# Patient Record
Sex: Female | Born: 1995 | Race: White | Hispanic: No | Marital: Single | Smoking: Never smoker
Health system: Southern US, Community
[De-identification: ages and names within clinical notes are randomized; demographics above are authoritative.]

---

## 2016-04-24 ENCOUNTER — Ambulatory Visit (INDEPENDENT_AMBULATORY_CARE_PROVIDER_SITE_OTHER): Payer: PPO

## 2016-04-24 ENCOUNTER — Encounter (HOSPITAL_COMMUNITY): Payer: Self-pay | Admitting: Emergency Medicine

## 2016-04-24 ENCOUNTER — Ambulatory Visit (HOSPITAL_COMMUNITY)
Admission: EM | Admit: 2016-04-24 | Discharge: 2016-04-24 | Disposition: A | Discharge status: Home / Self Care | Payer: PPO | Attending: Family Medicine | Admitting: Family Medicine

## 2016-04-24 DIAGNOSIS — W2107XA Struck by softball, initial encounter: Secondary | ICD-10-CM

## 2016-04-24 DIAGNOSIS — S6992XA Unspecified injury of left wrist, hand and finger(s), initial encounter: Secondary | ICD-10-CM

## 2016-04-24 MED ORDER — IBUPROFEN 800 MG PO TABS
ORAL_TABLET | ORAL | Status: AC
  Filled 2016-04-24: qty 1

## 2016-04-24 MED ORDER — IBUPROFEN 800 MG PO TABS
800.0000 mg | ORAL_TABLET | Freq: Once | ORAL | Status: AC
  Administered 2016-04-24: 800 mg via ORAL

## 2016-04-24 NOTE — ED Provider Notes (Signed)
CSN: [id number]     Arrival date & time 04/24/16  1858 History   None    Chief Complaint  Patient presents with  . Finger Injury   (Consider location/radiation/quality/duration/timing/severity/associated sxs/prior Treatment) Patient presents today for left hand injury onset 1300 today when a ball hit her thumb while playing softball. She reports pain and swelling at the area.       History reviewed. No pertinent past medical history. History reviewed. No pertinent surgical history. History reviewed. No pertinent family history. Social History  Substance Use Topics  . Smoking status: Never Smoker  . Smokeless tobacco: Never Used  . Alcohol use Yes   OB History    No data available     Review of Systems  Constitutional:       As stated in the HPI    Allergies  Penicillins  Home Medications   Prior to Admission medications   Medication Sig Start Date End Date Taking? Authorizing Provider  norethindrone-ethinyl estradiol (MICROGESTIN,JUNEL,LOESTRIN) 1-20 MG-MCG tablet Take 1 tablet by mouth daily.   Yes Historical Provider, MD   Meds Ordered and Administered this Visit   Medications  ibuprofen (ADVIL,MOTRIN) tablet 800 mg (800 mg Oral Given 04/24/16 1951)    BP 136/75 (BP Location: Left Arm)   Pulse 60   Temp 98.7 F (37.1 C) (Oral)   Resp 16   SpO2 100%  No data found.   Physical Exam  Constitutional: She is oriented to person, place, and time. She appears well-developed and well-nourished.  Cardiovascular: Normal rate, regular rhythm and normal heart sounds.   Pulmonary/Chest: Effort normal and breath sounds normal. She has no wheezes.  Musculoskeletal:  Left hand has full ROM. Left wrist also has full ROM. Mild swelling noted at the thumb. Is tender to palpate at the [place]ar region. Skin intact.   Neurological: She is alert and oriented to person, place, and time.  Skin: Skin is warm and dry.  Nursing note and vitals reviewed.   Urgent Care Course      Procedures (including critical care time)  Labs Review Labs Reviewed - No data to display  Imaging Review Dg Hand Complete Left  Result Date: 04/24/2016 CLINICAL DATA:  Sports injury with pain in the thumb and index finger EXAM: LEFT HAND - COMPLETE 3+ VIEW COMPARISON:  None. FINDINGS: There is no evidence of fracture or dislocation. There is no evidence of arthropathy or other focal bone abnormality. Soft tissues are unremarkable. IMPRESSION: No fracture or dislocation of the left hand. Electronically Signed   By: Deatra Robinson M.D.   On: 04/24/2016 19:49    MDM   1. Injury of left hand, initial encounter    Xray negative for fracture or dislocation.  1) Apply ice to the area 2) Rest the hand 3) Take ibuprofen for pain relief 4) Follow up with your primary care doctor if no improvement is noted.    Lucia Estelle, NP 04/24/16 2000

## 2016-04-24 NOTE — ED Triage Notes (Signed)
Pt c/o left thumb inj onset 1300  Was playing softball and ball hit her thumb   Sx today include: swelling, pain  A&O x4... NAD

## 2018-03-14 IMAGING — DX DG HAND COMPLETE 3+V*L*
3 series · 3 of 3 positions shown · non-contrast
Comparison: None.

CLINICAL DATA: Sports injury with pain in the thumb and index
finger

EXAM:
LEFT HAND - COMPLETE 3+ VIEW

[hand pa]
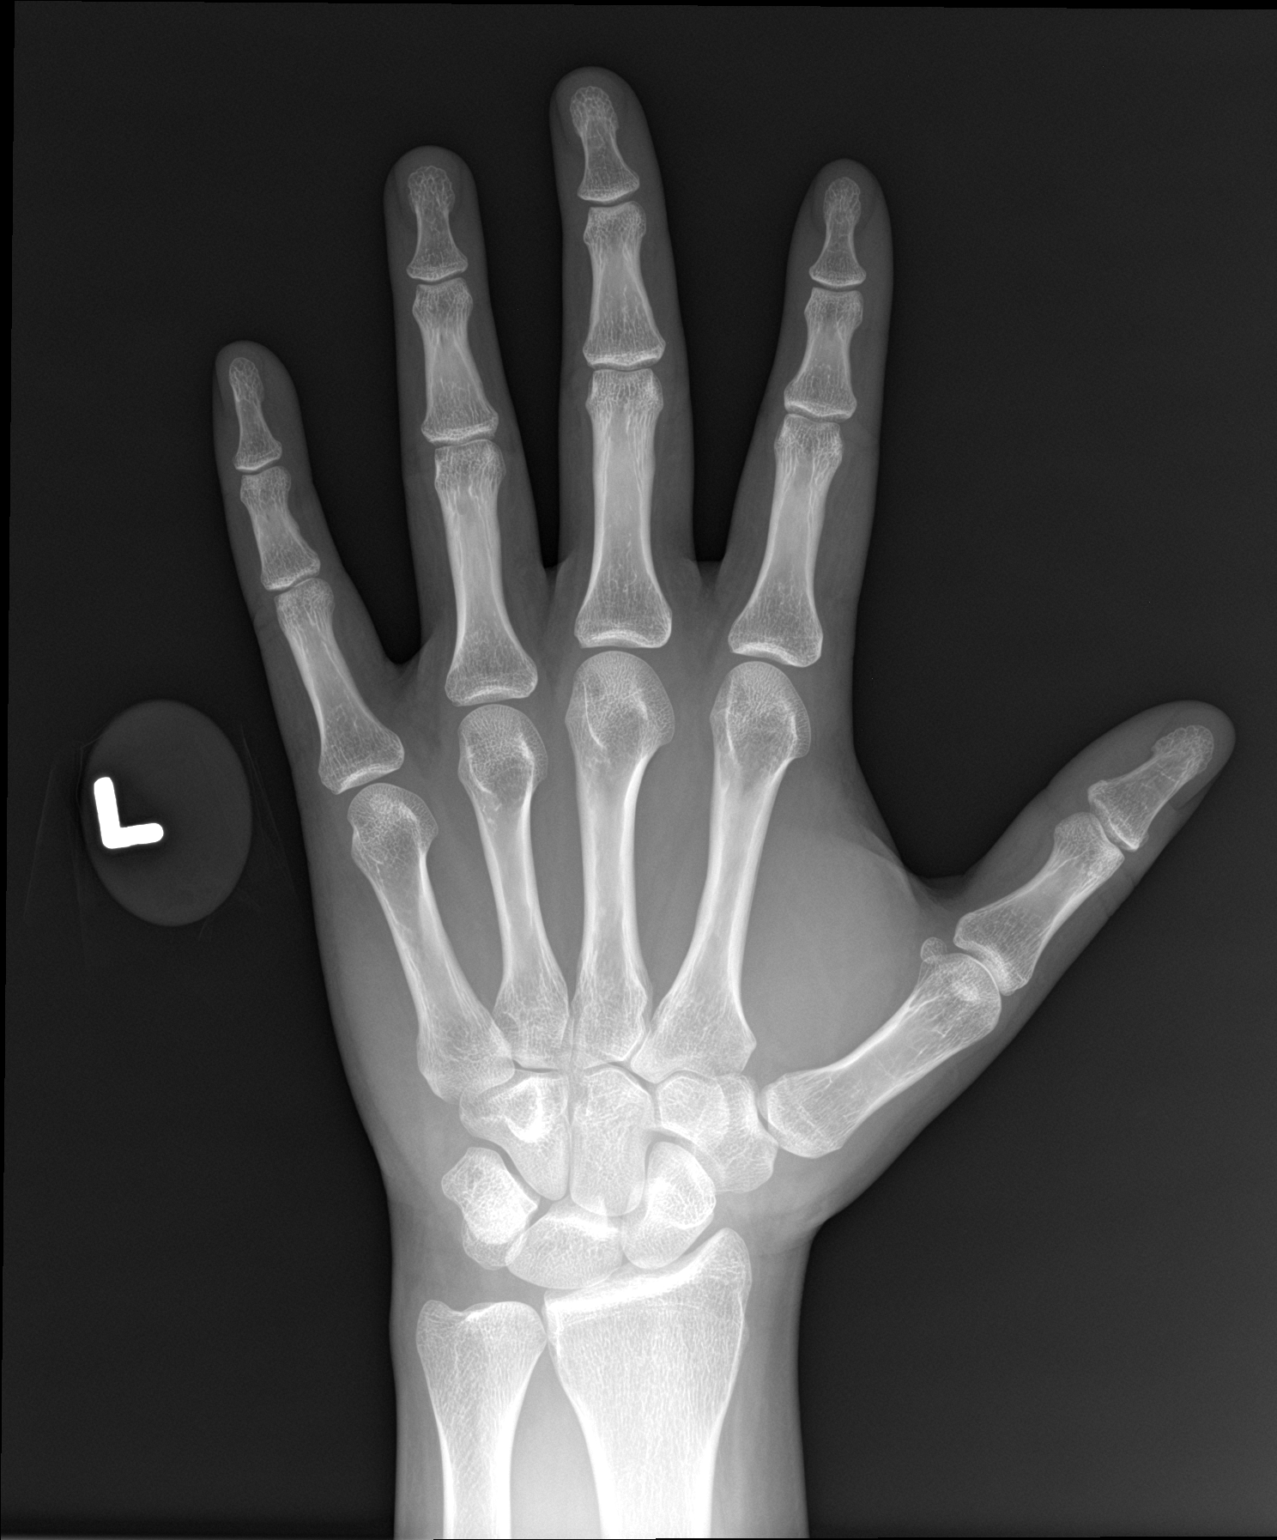

[hand obl]
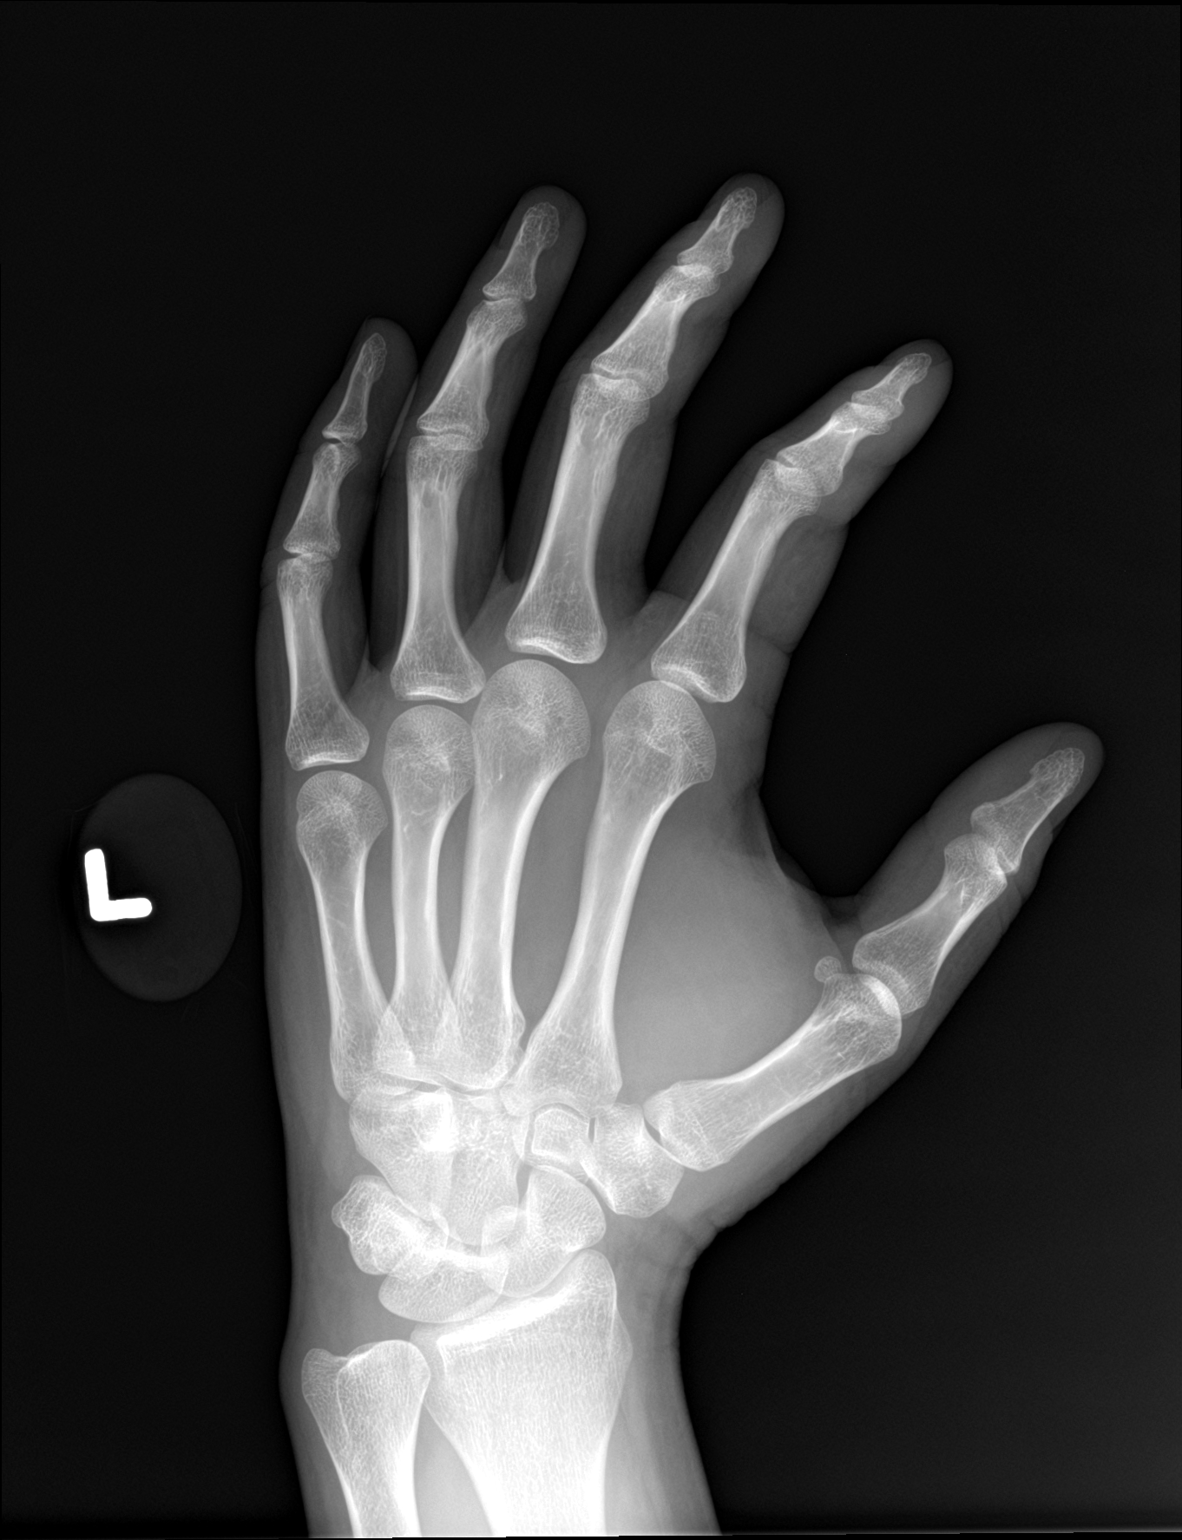

[hand lat]
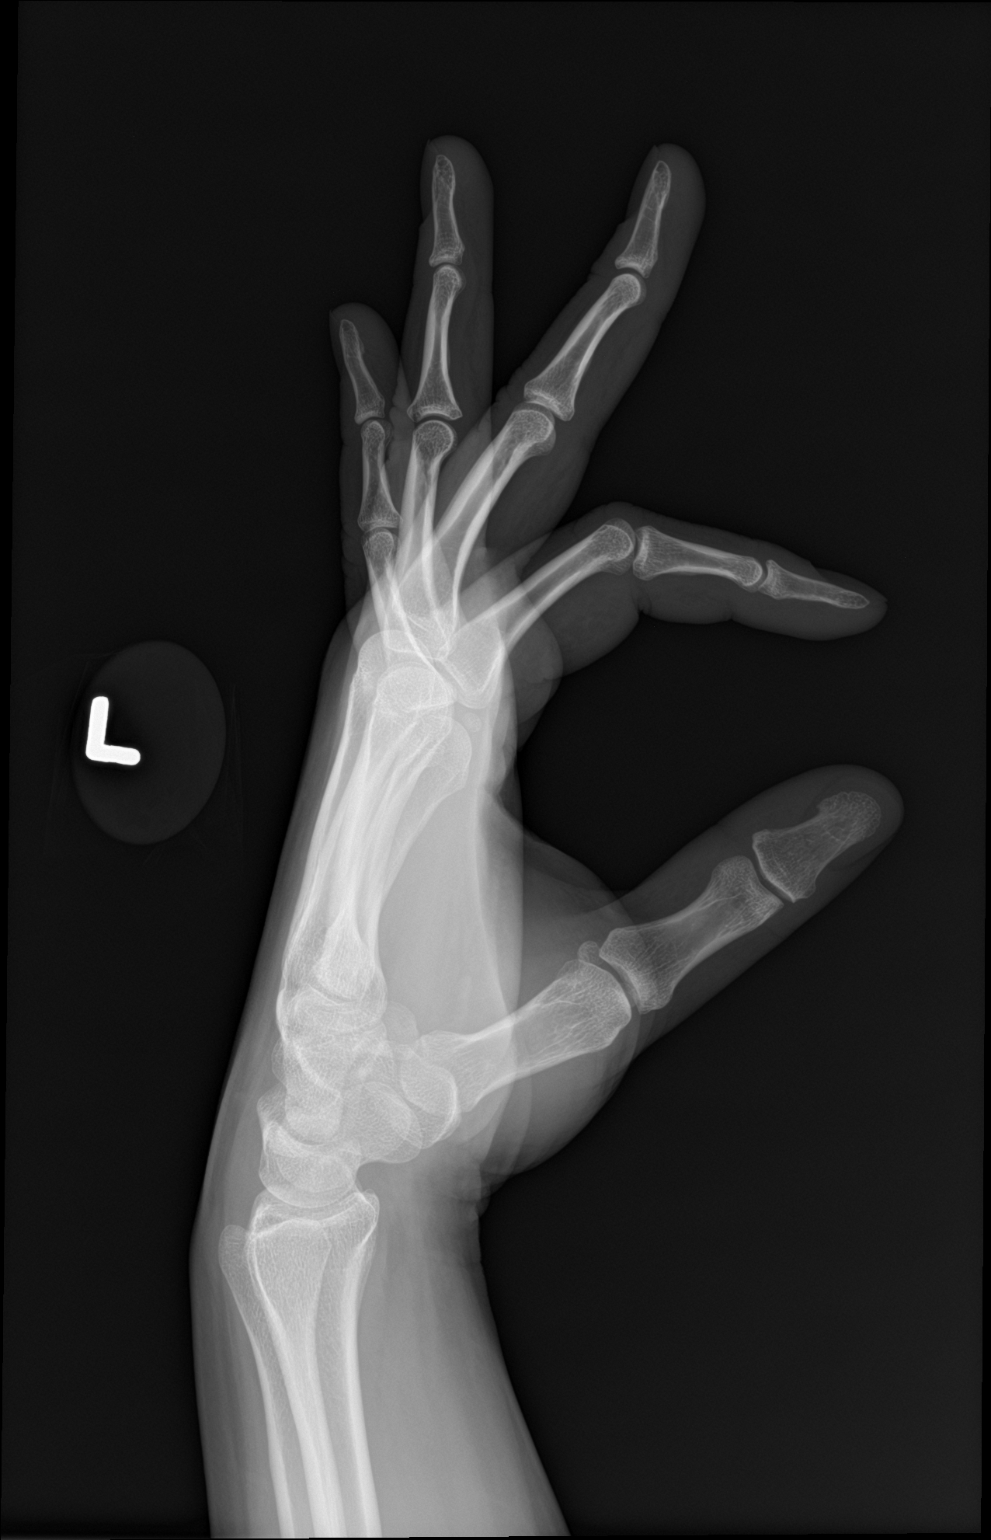

[3 of 3 positions shown; findings below may reference images not displayed]

FINDINGS: There is no evidence of fracture or dislocation. There is no
evidence of arthropathy or other focal bone abnormality. Soft
tissues are unremarkable.
IMPRESSION: No fracture or dislocation of the left hand.
